# Patient Record
Sex: Male | Born: 1980 | Race: White | Hispanic: No | Marital: Single | Smoking: Current every day smoker
Health system: Southern US, Community
[De-identification: ages and names within clinical notes are randomized; demographics above are authoritative.]

## PROBLEM LIST (undated history)

## (undated) HISTORY — PX: OTHER SURGICAL HISTORY: SHX169

---

## 1998-04-16 ENCOUNTER — Emergency Department (HOSPITAL_COMMUNITY): Admission: EM | Admit: 1998-04-16 | Discharge: 1998-04-16 | Payer: Self-pay | Admitting: Emergency Medicine

## 2000-01-14 ENCOUNTER — Emergency Department (HOSPITAL_COMMUNITY): Admission: EM | Admit: 2000-01-14 | Discharge: 2000-01-14 | Payer: Self-pay | Admitting: *Deleted

## 2004-07-14 ENCOUNTER — Emergency Department (HOSPITAL_COMMUNITY): Admission: EM | Admit: 2004-07-14 | Discharge: 2004-07-15 | Payer: Self-pay | Admitting: *Deleted

## 2007-07-23 ENCOUNTER — Emergency Department (HOSPITAL_COMMUNITY): Admission: EM | Admit: 2007-07-23 | Discharge: 2007-07-23 | Payer: Self-pay | Admitting: Emergency Medicine

## 2008-11-13 ENCOUNTER — Emergency Department (HOSPITAL_COMMUNITY): Admission: EM | Admit: 2008-11-13 | Discharge: 2008-11-14 | Payer: Self-pay | Admitting: Emergency Medicine
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
## 2015-06-22 ENCOUNTER — Emergency Department (HOSPITAL_COMMUNITY): Payer: Self-pay

## 2015-06-22 ENCOUNTER — Encounter (HOSPITAL_COMMUNITY): Payer: Self-pay | Admitting: Emergency Medicine

## 2015-06-22 ENCOUNTER — Emergency Department (HOSPITAL_COMMUNITY)
Admission: EM | Admit: 2015-06-22 | Discharge: 2015-06-22 | Discharge status: Against Medical Advice | Payer: Self-pay | Attending: Emergency Medicine | Admitting: Emergency Medicine

## 2015-06-22 DIAGNOSIS — Y9289 Other specified places as the place of occurrence of the external cause: Secondary | ICD-10-CM | POA: Insufficient documentation

## 2015-06-22 DIAGNOSIS — Y998 Other external cause status: Secondary | ICD-10-CM | POA: Insufficient documentation

## 2015-06-22 DIAGNOSIS — Y9389 Activity, other specified: Secondary | ICD-10-CM | POA: Insufficient documentation

## 2015-06-22 DIAGNOSIS — S6991XA Unspecified injury of right wrist, hand and finger(s), initial encounter: Secondary | ICD-10-CM | POA: Insufficient documentation

## 2015-06-22 DIAGNOSIS — F17200 Nicotine dependence, unspecified, uncomplicated: Secondary | ICD-10-CM | POA: Insufficient documentation
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
